# Patient Record
Sex: Male | Born: 1955 | Race: White | Hispanic: No | Marital: Married | Smoking: Current every day smoker
Health system: Southern US, Community
[De-identification: ages and names within clinical notes are randomized; demographics above are authoritative.]

## PROBLEM LIST (undated history)

## (undated) DIAGNOSIS — J449 Chronic obstructive pulmonary disease, unspecified: Secondary | ICD-10-CM

## (undated) DIAGNOSIS — I10 Essential (primary) hypertension: Secondary | ICD-10-CM

---

## 2018-03-23 ENCOUNTER — Emergency Department: Payer: BLUE CROSS/BLUE SHIELD

## 2018-03-23 ENCOUNTER — Encounter: Payer: Self-pay | Admitting: Emergency Medicine

## 2018-03-23 ENCOUNTER — Other Ambulatory Visit: Payer: Self-pay

## 2018-03-23 ENCOUNTER — Emergency Department
Admission: EM | Admit: 2018-03-23 | Discharge: 2018-03-23 | Discharge status: Against Medical Advice | Payer: BLUE CROSS/BLUE SHIELD | Attending: Emergency Medicine | Admitting: Emergency Medicine

## 2018-03-23 DIAGNOSIS — Y999 Unspecified external cause status: Secondary | ICD-10-CM | POA: Diagnosis not present

## 2018-03-23 DIAGNOSIS — I10 Essential (primary) hypertension: Secondary | ICD-10-CM | POA: Diagnosis not present

## 2018-03-23 DIAGNOSIS — S22060A Wedge compression fracture of T7-T8 vertebra, initial encounter for closed fracture: Secondary | ICD-10-CM | POA: Diagnosis not present

## 2018-03-23 DIAGNOSIS — Y92002 Bathroom of unspecified non-institutional (private) residence single-family (private) house as the place of occurrence of the external cause: Secondary | ICD-10-CM | POA: Insufficient documentation

## 2018-03-23 DIAGNOSIS — Y9389 Activity, other specified: Secondary | ICD-10-CM | POA: Insufficient documentation

## 2018-03-23 DIAGNOSIS — F17200 Nicotine dependence, unspecified, uncomplicated: Secondary | ICD-10-CM | POA: Insufficient documentation

## 2018-03-23 DIAGNOSIS — W010XXA Fall on same level from slipping, tripping and stumbling without subsequent striking against object, initial encounter: Secondary | ICD-10-CM | POA: Diagnosis not present

## 2018-03-23 DIAGNOSIS — S24109A Unspecified injury at unspecified level of thoracic spinal cord, initial encounter: Secondary | ICD-10-CM | POA: Diagnosis present

## 2018-03-23 DIAGNOSIS — G9520 Unspecified cord compression: Secondary | ICD-10-CM | POA: Diagnosis not present

## 2018-03-23 HISTORY — DX: Essential (primary) hypertension: I10

## 2018-03-23 MED ORDER — LORAZEPAM 2 MG/ML IJ SOLN
1.0000 mg | Freq: Once | INTRAMUSCULAR | Status: AC | PRN
  Administered 2018-03-23: 1 mg via INTRAVENOUS
  Filled 2018-03-23: qty 1

## 2018-03-23 MED ORDER — LORAZEPAM 2 MG/ML IJ SOLN
1.0000 mg | Freq: Once | INTRAMUSCULAR | Status: AC | PRN
  Administered 2018-03-23: 1 mg via INTRAVENOUS

## 2018-03-23 NOTE — Consult Note (Signed)
NEUROSURGERY INPATIENT CONSULT  Aaron Ortiz 1956/09/17 [id number] 03/23/2018 0  Date of Service:  03/23/2018  Patient Care Team: Patient, No Pcp Per as PCP - General (General Practice)  Consult Requested by:  Nita Sickle  Chief Complaint: Difficulty walking  History of Present Illness: 62 year old gentleman was in his normal state of health until he suffered a fall in February 2019.  Noted some mild back pain afterwards.  Slowly he developed difficulty with ambulation.  He began having difficulty controlling his legs and noticing leg weakness.  He began using a cane for assistance with ambulation.  This progressed to where he is now using a walker.  The patient presented to the emergency department today for evaluation.  Imaging was obtained of the thoracic spine demonstrating a fracture as well as cord signal change at the location of the fracture.  Currently the patient denies back pain.  He reports numbness from the mid abdomen down to his feet.  He describes weakness in his legs.  He describes difficulty with standing and walking and transferring from sitting to stand and stand to sit.  He currently denies any incontinence of bowel and urine.  He reports that he has not had erectile function for a long time.  He denies a history of diabetes.  He admits to a history of hypertension.  He admits that he has not been seen by medical professional in quite some time.  Past Medical History  There are no active problems to display for this patient.   Past Medical History:  Diagnosis Date  . Hypertension      History reviewed. No pertinent surgical history.   No Known Allergies   Social History   Tobacco Use  . Smoking status: Current Every Day Smoker  . Smokeless tobacco: Never Used  Substance Use Topics  . Alcohol use: Not on file     No family history on file.   ROS: As per HPI all others negative  EXAMINATION  VITALS: BP (!) 142/86   Pulse 74   Temp 97.8 F (36.6 C)  (Oral)   Resp 18   Ht  (1.778 m)   Wt 111.1 kg (245 lb)   SpO2 93%   BMI 35.15 kg/m    Vitals Current Average / Min / Max     Temp    97.8 F (36.6 C)    Temp  Min: 97.8 F (36.6 C)  Max: 97.8 F (36.6 C)     BP     (!) 142/86     BP  Min: 142/86  Max: 189/69     HR    74    Pulse  Avg: 77  Min: 74  Max: 79     RR    18    Resp  Avg: 18.7  Min: 18  Max: 20     Sats    93 %    SpO2  Min: 93 %  Max: 98 %     Weight    111.1 kg (245 lb)    Admit: 111.1 kg (245 lb)   GENERAL: Awake, alert, oriented by 3   MUSCULOSKELETAL:  Normal bulk and tone of upper and lower extremities.   No abnormal movements or fasiculations.   HIGHER INTEGRATIVE FUNCTIONS:  GCS: 15 Oriented to person, place, and time Recent and remote memory normal   STRENGTH:  Bedside strength is 5/5; the patient is obviously weak when standing and transitioning from sit to stand, stand to  sit   REFLEXES: Normal 1+ patellar and Achilles bilaterally.  Babinski: Toes downgoing Clonus: None Impaired position sense bilaterally   SENSATION: Diffuse hypesthesia extending from the mid abdomen to the feet bilaterally  IMAGING: I personally reviewed the patient's thoracic MRI with the patient.  This shows a T7 fracture with kyphotic angulation, stretching of the thoracic cord, with abnormal cord signal change.  Please see the radiology report for additional details.  CT of the thoracic spine demonstrates T7 fracture.  Please see the radiology report for additional details.  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  I discussed in detail with the patient and his wife the nature, risks, and alternatives of the various management options.  Given the progressive deterioration of his neurologic functioning as well as the severe fracture and thoracic kyphosis, we recommend transfer for surgical  decompression and stabilization.  The procedure is beyond the capability of [facility].  The patient is apprehensive to proceed with surgery.  He repeated multiple times that he needed to think about this.  His wife encouraged him to agree to the transfer for treatment.  The patient however ultimately declined.  Dr. Don Perking also spoke with the patient regarding the risks of not proceeding with surgical intervention and the patient remained adamant that he did not want to transfer and would rather go home to consider his options.  He was provided with the contact numbers for the neurosurgery department at [facility] as well as [facility].  He was encouraged that should he change his mind that he proceed directly to either the [facility] or the [facility] emergency department.  This note was dictated using voice recognition software.  Please contact me if there are any questions regarding its content.  Electronically signed by: Ninfa Meeker, III, MD 03/23/2018 1:08 PM

## 2018-03-23 NOTE — ED Notes (Signed)
Pt back from CT at this time 

## 2018-03-23 NOTE — ED Notes (Signed)
Patient transported to CT 

## 2018-03-23 NOTE — ED Provider Notes (Signed)
[facility] Emergency Department Provider Note  ____________________________________________  Time seen: Approximately 7:33 AM  I have reviewed the triage vital signs and the nursing notes.   HISTORY  Chief Complaint Back Pain   HPI Aaron Ortiz is a 62 y.o. male with a history of hypertension who presents for evaluation of back pain.  Patient reports that he has had back pain and numbness in his bilateral lower extremities for 3 weeks.  He reports that he slipped in the bathroom and fell forward onto the ground.  He reports mild pain in his lower thoracic/upper lumbar spine region that was present for 1 day after the fall. The pain resolved with no intervention.  Patient denies having back pain at this time however reports that he feels numb starting at the same level on his back all the way down to his feet.  The numbness started immediately after the fall and has remained constant for 3 weeks and is not getting worse.  According to the wife his gait has been a little "wobbly" for the last 3 weeks.  Patient denies saddle anesthesia, urinary or bowel incontinence or retention, unintentional weight loss, night sweats, history of cancer.  Past Medical History:  Diagnosis Date  . Hypertension     Prior to Admission medications   Not on File    Allergies Patient has no known allergies.  FH No FH of cancer  Social History Social History   Tobacco Use  . Smoking status: Current Every Day Smoker  . Smokeless tobacco: Never Used  Substance Use Topics  . Alcohol use: Not on file  . Drug use: Not on file    Review of Systems  Constitutional: Negative for fever. Eyes: Negative for visual changes. ENT: Negative for sore throat. Neck: No neck pain  Cardiovascular: Negative for chest pain. Respiratory: Negative for shortness of breath. Gastrointestinal: Negative for abdominal pain, vomiting or diarrhea. Genitourinary: Negative for  dysuria. Musculoskeletal: + back pain. Skin: Negative for rash. Neurological: Negative for headaches. + numbness of bilateral legs Psych: No SI or HI  ____________________________________________   PHYSICAL EXAM:  VITAL SIGNS: ED Triage Vitals  Enc Vitals Group     BP 03/23/18 0527 (!) 189/69     Pulse Rate 03/23/18 0527 79     Resp 03/23/18 0527 20     Temp 03/23/18 0527 97.8 F (36.6 C)     Temp Source 03/23/18 0527 Oral     SpO2 03/23/18 0527 97 %     Weight 03/23/18 0526 245 lb (111.1 kg)     Height 03/23/18 0526  (1.778 m)     Head Circumference --      Peak Flow --      Pain Score 03/23/18 0626 0     Pain Loc --      Pain Edu? --      Excl. in GC? --     Constitutional: Alert and oriented. Well appearing and in no apparent distress. HEENT:      Head: Normocephalic and atraumatic.         Eyes: Conjunctivae are normal. Sclera is non-icteric.       Mouth/Throat: Mucous membranes are moist.       Neck: Supple with no signs of meningismus. Cardiovascular: Regular rate and rhythm. No murmurs, gallops, or rubs. 2+ symmetrical distal pulses are present in all extremities. No JVD. Respiratory: Normal respiratory effort. Lungs are clear to auscultation bilaterally. No wheezes, crackles, or rhonchi.  Gastrointestinal: Soft, non tender, and non distended with positive bowel sounds. No rebound or guarding. Musculoskeletal: no Tenderness to palpation over CT and L-spine. Neurologic: Normal speech and language. Face is symmetric.  Intact strength and sensation to painful stimuli, DTRs are 1+ bilaterally, PVR 26 cc  skin: Skin is warm, dry and intact. No rash noted. Psychiatric: Mood and affect are normal. Speech and behavior are normal.  ____________________________________________   LABS (all labs ordered are listed, but only abnormal results are displayed)  Labs Reviewed - No data to  display ____________________________________________  EKG  none ____________________________________________  RADIOLOGY  I have personally reviewed the images performed during this visit and I agree with the Radiologist's read.   Interpretation by Radiologist:  Ct Thoracic Spine Wo Contrast  Result Date: 03/23/2018 CLINICAL DATA:  Injury 3 weeks ago with persistent back pain. EXAM: CT THORACIC SPINE WITHOUT CONTRAST TECHNIQUE: Multidetector CT images of the thoracic were obtained using the standard protocol without intravenous contrast. COMPARISON:  None. FINDINGS: Alignment: Exaggerated thoracic kyphosis.  No listhesis. Vertebrae: T7 body fracture with compression and sclerosis. Anteriorly there is vertebral plana. Retropulsion is mild, but the underlying thecal sac is already effaced by dorsal epidural fat expansion. There is paravertebral strandy edematous appearance suggesting subacute timing. No visible paravertebral tumor or erosive component. There is a T7 spinous process fracture with well corticated/healed appearing margins. Right fourth, fifth, and sixth rib neck is show healing fractures with callus, nondisplaced. No evidence of discitis or aggressive bone lesion. Paraspinal and other soft tissues: Large hiatal hernia. 6 mm average diameter subpleural pulmonary nodule in the medial right lower lobe. Disc levels: Diffuse spondylosis and disc narrowing. Diffuse mild facet hypertrophy. Generalized narrow appearance of the foramina IMPRESSION: 1. T7 body fracture that is likely subacute. Anterior height loss is severe. Retropulsion is mild but there is probable cord impingement - the thecal sac is effaced from behind by epidural fat expansion. MRI could further evaluate. 2. T7 spinous process fracture with corticated margins. There is exaggerated thoracic kyphosis without listhesis. 3. Healing right fourth, fifth, and sixth rib neck fractures. 4. 6 mm right lower lobe pulmonary nodule.  Non-contrast chest CT at 6-12 months is recommended. If the nodule is stable at time of repeat CT, then future CT at 18-24 months (from today's scan) is considered optional for low-risk patients, but is recommended for high-risk patients. This recommendation follows the consensus statement: Guidelines for Management of Incidental Pulmonary Nodules Detected on CT Images: From the Fleischner Society 2017; Radiology 2017; 284:228-243. 5. Intrathoracic stomach. Electronically Signed   By: Marnee Spring M.D.   On: 03/23/2018 08:04   Ct Lumbar Spine Wo Contrast  Result Date: 03/23/2018 CLINICAL DATA:  Fall 3 weeks ago with back pain.  Initial encounter. EXAM: CT LUMBAR SPINE WITHOUT CONTRAST TECHNIQUE: Multidetector CT imaging of the lumbar spine was performed without intravenous contrast administration. Multiplanar CT image reconstructions were also generated. COMPARISON:  None. FINDINGS: Segmentation: Based on the lowest ribs there are 5 lumbar type vertebral bodies. Alignment: Grade 1 anterolisthesis at L5-S1 Vertebrae: Negative for acute fracture. No destructive changes or discitis. Chronic bilateral L5 pars defects. Paraspinal and other soft tissues: 2.5 cm left adrenal nodule with densitometry suggesting adenoma. Disc levels: Generalized fairly mild spondylosis from T12-L1 to L4-5. L5 chronic bilateral pars defects with grade 1 L5-S1 anterolisthesis. There is focal disc degeneration with central disc fissure containing gas. Disc height loss, bulge, and slip causes advanced biforaminal L5 impingement. Patent spinal canal IMPRESSION: 1.  No acute finding. 2. L5 chronic bilateral pars defects with listhesis and accelerated disc degeneration. Biforaminal L5 compression. 3. 2.5 cm left adrenal nodule with densitometry indicating adenoma. Electronically Signed   By: Marnee Spring M.D.   On: 03/23/2018 08:07   Mr Thoracic Spine Wo Contrast  Addendum Date: 03/23/2018   ADDENDUM REPORT: 03/23/2018 12:22 ADDENDUM:  Critical Value/emergent results were called by telephone at the time of interpretation on 03/23/2018 at 1217 hours to Dr. Scotty Court in the ED, who verbally acknowledged these results. Electronically Signed   By: Odessa Fleming M.D.   On: 03/23/2018 12:22   Result Date: 03/23/2018 CLINICAL DATA:  62 year old male status post slip and fall at home 3 weeks ago with persistent back pain. Pain and numbness radiating to the legs. Severe T7 compression fracture on thoracic spine CT today. EXAM: MRI THORACIC SPINE WITHOUT CONTRAST TECHNIQUE: Multiplanar, multisequence MR imaging of the thoracic spine was performed. No intravenous contrast was administered. COMPARISON:  Thoracic spine CT 0727 hours today. FINDINGS: Limited cervical spine imaging:  Negative. Thoracic spine segmentation:  Normal. Alignment: Stable from the CT earlier today demonstrating focal kyphosis of about 23 degrees about the abnormal T7 vertebral body. Vertebrae: Severe T7 compression fracture approaching vertebra plana. There is abnormal T1 marrow signal within the compressed body, but STIR imaging demonstrates only trace marrow edema in the body. There is up to moderate marrow edema demonstrated in the bilateral T7-T8 facets. Otherwise the posterior elements of those levels appear intact. There is trace marrow edema at the posterior inferior T6 endplate which is also probably reactive. There is mild marrow edema in the posterior right 7th rib where a nondisplaced fracture is evident on the CT today. The other regional posterior rib marrow signal appears normal. The other thoracic levels are intact. No other marrow edema or acute osseous abnormality identified. Cord: There is abnormal spinal cord signal at the T7 level corresponding to spinal stenosis due to combined mild bony retropulsion and moderate thoracic epidural lipomatosis. See series 8, image 7 and series 7, image 19. Mild associated spinal cord mass effect. Above and below that level spinal cord  signal appears to remain normal despite additional spinal stenosis primarily due to epidural lipomatosis. Paraspinal and other soft tissues: Confluent paraspinal soft tissue edema laterally at the T7-T8 levels. This is seen on series 8, image 12 on the left side. The posterior paraspinal soft tissues remain within normal limits. Stable visible thoracic and upper abdominal viscera from the CT today including hiatal hernia. Disc levels: No thoracic spinal stenosis above T5. T5-T6: Mild to moderate thoracic epidural lipomatosis resulting in mild spinal stenosis. T6-T7: Moderate thoracic epidural lipomatosis contributing to spinal stenosis along with mild T7 retropulsion. There is also up to severe bilateral T6 foraminal stenosis related to the T7 compression fracture and loss of height, greater on the left. T7-T8: Moderate thoracic epidural lipomatosis contributing to spinal stenosis along with the T7 retropulsion. There is associated moderate to severe bilateral T7 foraminal stenosis, greater on the right. T8-T9: Mild disc bulging and moderate thoracic epidural lipomatosis resulting in spinal stenosis with effaced CSF from the thecal sac but mild if any cord mass effect. T9-T10: Continued epidural lipomatosis but less thoracic spinal stenosis at this level with some preserved CSF within the thecal sac. T10-T11: Mild disc bulge. Decreasing epidural lipomatosis. Mild to moderate facet hypertrophy. Mild spinal stenosis without cord mass effect. Mild left and moderate right T10 foraminal stenosis. T11-T12: No stenosis. IMPRESSION: 1. Severe T7 compression fracture  approaching vertebra plana. Combined mild retropulsion plus moderate thoracic epidural lipomatosis results in spinal cord compression with abnormal spinal cord signal as seen on series 8, image 7. Recommend Spine Surgery Consultation. 2. Up to moderate mid and lower thoracic spine epidural lipomatosis primarily contributing to thoracic spinal stenosis from T5-T6  to T10-T11. No other abnormal thoracic spinal cord signal identified. 3. Little marrow edema in the severely compressed T7 body. There is moderate marrow edema in the bilateral T7 and T8 facets which is probably stress reaction in light of no discrete facet fractures on the CT Thoracic Spine today. Mild similar edema in the posterior inferior T6 vertebral body. 4. Fractured right 7th rib costovertebral junction with some marrow edema. Electronically Signed: By: Odessa Fleming M.D. On: 03/23/2018 12:14      ____________________________________________   PROCEDURES  Procedure(s) performed: None Procedures Critical Care performed:  None ____________________________________________   INITIAL IMPRESSION / ASSESSMENT AND PLAN / ED COURSE   62 y.o. male with a history of hypertension who presents for evaluation of back pain and b/l LE numbness.  Patient is well-appearing, no distress, has no tenderness on midline of his spine, has symmetric 1+ DTRs bilateral lower extremities, intact strength and sensation to pinprick, normal PVR 26cc. Due to traumatic nature of the injury patient was sent for CT of the spine which showed T7 compression fracture and retropulsion. MRI pending.   _________________________ 1:24 PM on 03/23/2018 -----------------------------------------  MRI was done which is consistent with severe T7 compression fracture and epidural lipomatosis resulting in severe spinal cord compression.  Patient was evaluated in the emergency department by Dr. Riley Nearing, NSG on call who recommended emergent transfer to [facility] for surgical repair.  Patient told both of his that he does not wish surgery.  He was made very clear to the patient by me and Dr. Riley Nearing that this injury will most likely put him on a wheelchair for the rest of his life and make him paralyzed from the waist down.  Patient understand the risks associated with this but wishes to go home and think about it before deciding if he wants to  do surgery.  Patient does understand the severity and how time sensitive this injury is but continues to demand to go home.  His wife is with him and will do his wishes.  I am referring him to [facility] neurosurgery.  I told him that if he changes his mind for him to go immediately to [facility] or [facility] emergency department as this injury cannot be repaired at this hospital.  Patient understands that.  He is leaving AGAINST MEDICAL ADVICE.      As part of my medical decision making, I reviewed the following data within the electronic MEDICAL RECORD NUMBER History obtained from family, Nursing notes reviewed and incorporated, Radiograph reviewed , A consult was requested and obtained from this/these consultant(s) Neurosurgery, Notes from prior ED visits and [place] Controlled Substance Database    Pertinent labs & imaging results that were available during my care of the patient were reviewed by me and considered in my medical decision making (see chart for details).    ____________________________________________   FINAL CLINICAL IMPRESSION(S) / ED DIAGNOSES  Final diagnoses:  Spinal cord compression (HCC)  Compression fracture of T7 vertebra (HCC)      NEW MEDICATIONS STARTED DURING THIS VISIT:  ED Discharge Orders    None       Note:  This document was prepared using Dragon voice recognition software and may  include unintentional dictation errors.    Don Perking, [place], MD 03/23/18 [phone]

## 2018-03-23 NOTE — ED Triage Notes (Addendum)
Pt to triage via w/c with no distress noted; st slipped in BR 3wks ago injuring back; c/o persistent back pain with numbness/pain to legs; pt denies c/o pain while sitting at present

## 2018-03-23 NOTE — ED Notes (Signed)
Pt pulses found on both feet with doppler. MD aware

## 2018-03-23 NOTE — ED Notes (Signed)
Pt taken to MRI by Genelle Bal EDT

## 2018-03-23 NOTE — Discharge Instructions (Addendum)
As we explained to you, you have a fracture in your back that is pushing against your spinal cord.  This may cause you to be paralyzed from the waist down and be on a wheelchair for the rest of your life.  Unfortunately you did not want surgical repair which is the only way to fix this problem.  If you change your mind please make sure to go to [facility] or [facility] emergency department as soon as possible for further evaluation as this injury cannot be fixed here.   please make sure to call [facility] neurosurgery's number above to schedule an appointment as soon as possible.

## 2018-03-23 NOTE — ED Notes (Signed)
Pt advised not to leave facility due to current condition but pt verbalized that he understood and still wants to leave and go home. Surgeon and attending aware and pt adamant about leaving. Pt signed AMA paperwork and was wheeled to car.

## 2018-03-23 NOTE — ED Notes (Signed)
Neurosurgeon at bedside °

## 2018-03-23 NOTE — ED Notes (Signed)
Pt resting quietly with wife at bedside. Awaiting MRI to call for exam.

## 2018-03-23 NOTE — ED Notes (Signed)
Pt has 28ml of urine left post void.

## 2018-05-31 ENCOUNTER — Other Ambulatory Visit: Payer: Self-pay | Admitting: Family Medicine

## 2018-05-31 DIAGNOSIS — I87339 Chronic venous hypertension (idiopathic) with ulcer and inflammation of unspecified lower extremity: Secondary | ICD-10-CM

## 2018-05-31 DIAGNOSIS — L97909 Non-pressure chronic ulcer of unspecified part of unspecified lower leg with unspecified severity: Secondary | ICD-10-CM

## 2018-06-01 ENCOUNTER — Other Ambulatory Visit: Payer: Self-pay | Admitting: Family Medicine

## 2018-06-01 DIAGNOSIS — I70223 Atherosclerosis of native arteries of extremities with rest pain, bilateral legs: Secondary | ICD-10-CM

## 2018-06-01 DIAGNOSIS — L97909 Non-pressure chronic ulcer of unspecified part of unspecified lower leg with unspecified severity: Secondary | ICD-10-CM

## 2018-06-01 DIAGNOSIS — I87339 Chronic venous hypertension (idiopathic) with ulcer and inflammation of unspecified lower extremity: Secondary | ICD-10-CM

## 2018-06-03 ENCOUNTER — Ambulatory Visit: Admission: RE | Admit: 2018-06-03 | Payer: BLUE CROSS/BLUE SHIELD | Source: Ambulatory Visit

## 2018-08-31 ENCOUNTER — Inpatient Hospital Stay
Admission: EM | Admit: 2018-08-31 | Discharge: 2018-09-24 | DRG: 871 | Disposition: E | Payer: BLUE CROSS/BLUE SHIELD | Attending: Internal Medicine | Admitting: Internal Medicine

## 2018-08-31 ENCOUNTER — Emergency Department: Payer: BLUE CROSS/BLUE SHIELD

## 2018-08-31 ENCOUNTER — Inpatient Hospital Stay
Admit: 2018-08-31 | Discharge: 2018-08-31 | Disposition: A | Discharge status: Home / Self Care | Payer: BLUE CROSS/BLUE SHIELD | Attending: Internal Medicine | Admitting: Internal Medicine

## 2018-08-31 ENCOUNTER — Other Ambulatory Visit: Payer: Self-pay

## 2018-08-31 DIAGNOSIS — A419 Sepsis, unspecified organism: Secondary | ICD-10-CM | POA: Diagnosis not present

## 2018-08-31 DIAGNOSIS — G8220 Paraplegia, unspecified: Secondary | ICD-10-CM | POA: Diagnosis present

## 2018-08-31 DIAGNOSIS — J189 Pneumonia, unspecified organism: Secondary | ICD-10-CM

## 2018-08-31 DIAGNOSIS — T17990A Other foreign object in respiratory tract, part unspecified in causing asphyxiation, initial encounter: Secondary | ICD-10-CM | POA: Diagnosis present

## 2018-08-31 DIAGNOSIS — I248 Other forms of acute ischemic heart disease: Secondary | ICD-10-CM | POA: Diagnosis present

## 2018-08-31 DIAGNOSIS — F17210 Nicotine dependence, cigarettes, uncomplicated: Secondary | ICD-10-CM | POA: Diagnosis present

## 2018-08-31 DIAGNOSIS — X58XXXA Exposure to other specified factors, initial encounter: Secondary | ICD-10-CM | POA: Diagnosis present

## 2018-08-31 DIAGNOSIS — M4854XA Collapsed vertebra, not elsewhere classified, thoracic region, initial encounter for fracture: Secondary | ICD-10-CM | POA: Diagnosis present

## 2018-08-31 DIAGNOSIS — I110 Hypertensive heart disease with heart failure: Secondary | ICD-10-CM | POA: Diagnosis present

## 2018-08-31 DIAGNOSIS — N179 Acute kidney failure, unspecified: Secondary | ICD-10-CM | POA: Diagnosis present

## 2018-08-31 DIAGNOSIS — J440 Chronic obstructive pulmonary disease with acute lower respiratory infection: Secondary | ICD-10-CM | POA: Diagnosis present

## 2018-08-31 DIAGNOSIS — R7989 Other specified abnormal findings of blood chemistry: Secondary | ICD-10-CM

## 2018-08-31 DIAGNOSIS — J9601 Acute respiratory failure with hypoxia: Secondary | ICD-10-CM

## 2018-08-31 DIAGNOSIS — R778 Other specified abnormalities of plasma proteins: Secondary | ICD-10-CM

## 2018-08-31 DIAGNOSIS — J181 Lobar pneumonia, unspecified organism: Secondary | ICD-10-CM | POA: Diagnosis present

## 2018-08-31 DIAGNOSIS — I872 Venous insufficiency (chronic) (peripheral): Secondary | ICD-10-CM | POA: Diagnosis present

## 2018-08-31 DIAGNOSIS — K7200 Acute and subacute hepatic failure without coma: Secondary | ICD-10-CM | POA: Diagnosis present

## 2018-08-31 DIAGNOSIS — J9690 Respiratory failure, unspecified, unspecified whether with hypoxia or hypercapnia: Secondary | ICD-10-CM

## 2018-08-31 DIAGNOSIS — E875 Hyperkalemia: Secondary | ICD-10-CM | POA: Diagnosis present

## 2018-08-31 DIAGNOSIS — Z79899 Other long term (current) drug therapy: Secondary | ICD-10-CM

## 2018-08-31 DIAGNOSIS — Z66 Do not resuscitate: Secondary | ICD-10-CM | POA: Diagnosis present

## 2018-08-31 DIAGNOSIS — G9341 Metabolic encephalopathy: Secondary | ICD-10-CM | POA: Diagnosis present

## 2018-08-31 DIAGNOSIS — R6521 Severe sepsis with septic shock: Secondary | ICD-10-CM | POA: Diagnosis present

## 2018-08-31 DIAGNOSIS — I509 Heart failure, unspecified: Secondary | ICD-10-CM | POA: Diagnosis present

## 2018-08-31 HISTORY — DX: Chronic obstructive pulmonary disease, unspecified: J44.9

## 2018-08-31 LAB — CBC
HCT: 49.2 % (ref 40.0–52.0)
Hemoglobin: 16.7 g/dL (ref 13.0–18.0)
MCH: 35.2 pg — ABNORMAL HIGH (ref 26.0–34.0)
MCHC: 34 g/dL (ref 32.0–36.0)
MCV: 103.3 fL — AB (ref 80.0–100.0)
PLATELETS: 247 10*3/uL (ref 150–440)
RBC: 4.76 MIL/uL (ref 4.40–5.90)
RDW: 13 % (ref 11.5–14.5)
WBC: 17.3 10*3/uL — ABNORMAL HIGH (ref 3.8–10.6)

## 2018-08-31 LAB — LACTIC ACID, PLASMA
LACTIC ACID, VENOUS: 1.5 mmol/L (ref 0.5–1.9)
Lactic Acid, Venous: 1.8 mmol/L (ref 0.5–1.9)

## 2018-08-31 LAB — COMPREHENSIVE METABOLIC PANEL
ALBUMIN: 3.8 g/dL (ref 3.5–5.0)
ALT: 1077 U/L — AB (ref 0–44)
AST: 673 U/L — AB (ref 15–41)
Alkaline Phosphatase: 92 U/L (ref 38–126)
Anion gap: 15 (ref 5–15)
BUN: 55 mg/dL — AB (ref 8–23)
CHLORIDE: 82 mmol/L — AB (ref 98–111)
CO2: 35 mmol/L — AB (ref 22–32)
CREATININE: 3.01 mg/dL — AB (ref 0.61–1.24)
Calcium: 9.2 mg/dL (ref 8.9–10.3)
GFR calc Af Amer: 24 mL/min — ABNORMAL LOW (ref >60)
GFR, EST NON AFRICAN AMERICAN: 20 mL/min — AB (ref >60)
GLUCOSE: 126 mg/dL — AB (ref 70–99)
Potassium: 5.8 mmol/L — ABNORMAL HIGH (ref 3.5–5.1)
Sodium: 132 mmol/L — ABNORMAL LOW (ref 135–145)
Total Bilirubin: 1.2 mg/dL (ref 0.3–1.2)
Total Protein: 7.8 g/dL (ref 6.5–8.1)

## 2018-08-31 LAB — BASIC METABOLIC PANEL
Anion gap: 10 (ref 5–15)
BUN: 59 mg/dL — AB (ref 8–23)
CHLORIDE: 91 mmol/L — AB (ref 98–111)
CO2: 33 mmol/L — AB (ref 22–32)
CREATININE: 2.99 mg/dL — AB (ref 0.61–1.24)
Calcium: 8.7 mg/dL — ABNORMAL LOW (ref 8.9–10.3)
GFR calc Af Amer: 24 mL/min — ABNORMAL LOW (ref >60)
GFR calc non Af Amer: 21 mL/min — ABNORMAL LOW (ref >60)
GLUCOSE: 101 mg/dL — AB (ref 70–99)
Potassium: 5.5 mmol/L — ABNORMAL HIGH (ref 3.5–5.1)
Sodium: 134 mmol/L — ABNORMAL LOW (ref 135–145)

## 2018-08-31 LAB — GLUCOSE, CAPILLARY
GLUCOSE-CAPILLARY: 131 mg/dL — AB (ref 70–99)
GLUCOSE-CAPILLARY: 104 mg/dL — AB (ref 70–99)
Glucose-Capillary: 85 mg/dL (ref 70–99)

## 2018-08-31 LAB — TROPONIN I
TROPONIN I: 0.92 ng/mL — AB (ref <0.03)
TROPONIN I: 1.2 ng/mL — AB (ref <0.03)

## 2018-08-31 LAB — BRAIN NATRIURETIC PEPTIDE: B Natriuretic Peptide: 1227 pg/mL — ABNORMAL HIGH (ref 0.0–100.0)

## 2018-08-31 LAB — MRSA PCR SCREENING: MRSA by PCR: NEGATIVE

## 2018-08-31 MED ORDER — SODIUM CHLORIDE 0.9 % IV SOLN
1.0000 g | INTRAVENOUS | Status: DC
  Filled 2018-08-31: qty 10

## 2018-08-31 MED ORDER — INSULIN ASPART 100 UNIT/ML ~~LOC~~ SOLN
10.0000 [IU] | Freq: Once | SUBCUTANEOUS | Status: AC
  Administered 2018-08-31: 10 [IU] via INTRAVENOUS
  Filled 2018-08-31: qty 1

## 2018-08-31 MED ORDER — PHENYLEPHRINE HCL-NACL 10-0.9 MG/250ML-% IV SOLN
0.0000 ug/min | INTRAVENOUS | Status: DC
  Administered 2018-08-31: 20 ug/min via INTRAVENOUS
  Administered 2018-09-01: 150 ug/min via INTRAVENOUS
  Administered 2018-09-01: 80 ug/min via INTRAVENOUS
  Filled 2018-08-31: qty 10
  Filled 2018-08-31: qty 250
  Filled 2018-08-31: qty 10
  Filled 2018-08-31: qty 250
  Filled 2018-08-31: qty 40
  Filled 2018-08-31: qty 250

## 2018-08-31 MED ORDER — SODIUM CHLORIDE 0.9 % IV SOLN
500.0000 mg | INTRAVENOUS | Status: DC
  Filled 2018-08-31: qty 500

## 2018-08-31 MED ORDER — METHYLPREDNISOLONE SODIUM SUCC 125 MG IJ SOLR: 60.0000 mg | INTRAMUSCULAR | Status: DC

## 2018-08-31 MED ORDER — ACETAMINOPHEN 650 MG RE SUPP: 650.0000 mg | Freq: Four times a day (QID) | RECTAL | Status: DC | PRN

## 2018-08-31 MED ORDER — METHYLPREDNISOLONE SODIUM SUCC 125 MG IJ SOLR
60.0000 mg | INTRAMUSCULAR | Status: DC
  Administered 2018-08-31: 60 mg via INTRAVENOUS
  Filled 2018-08-31: qty 2

## 2018-08-31 MED ORDER — IPRATROPIUM-ALBUTEROL 0.5-2.5 (3) MG/3ML IN SOLN
3.0000 mL | Freq: Four times a day (QID) | RESPIRATORY_TRACT | Status: DC
  Administered 2018-08-31 – 2018-09-01 (×3): 3 mL via RESPIRATORY_TRACT
  Filled 2018-08-31 (×3): qty 3

## 2018-08-31 MED ORDER — BISACODYL 5 MG PO TBEC
5.0000 mg | DELAYED_RELEASE_TABLET | Freq: Every day | ORAL | Status: DC | PRN
  Filled 2018-08-31: qty 1

## 2018-08-31 MED ORDER — SODIUM BICARBONATE 8.4 % IV SOLN
50.0000 meq | Freq: Once | INTRAVENOUS | Status: AC
  Administered 2018-08-31: 50 meq via INTRAVENOUS
  Filled 2018-08-31: qty 50

## 2018-08-31 MED ORDER — SODIUM CHLORIDE 0.9 % IV SOLN
500.0000 mg | INTRAVENOUS | Status: DC
  Administered 2018-08-31: 500 mg via INTRAVENOUS
  Filled 2018-08-31 (×2): qty 500

## 2018-08-31 MED ORDER — ONDANSETRON HCL 4 MG/2ML IJ SOLN: 4.0000 mg | Freq: Four times a day (QID) | INTRAMUSCULAR | Status: DC | PRN

## 2018-08-31 MED ORDER — INSULIN ASPART 100 UNIT/ML ~~LOC~~ SOLN
10.0000 [IU] | Freq: Once | SUBCUTANEOUS | Status: DC
  Filled 2018-08-31: qty 1

## 2018-08-31 MED ORDER — SODIUM CHLORIDE 0.9 % IV SOLN
1.0000 g | Freq: Once | INTRAVENOUS | Status: AC
  Administered 2018-08-31: 1 g via INTRAVENOUS
  Filled 2018-08-31: qty 10

## 2018-08-31 MED ORDER — SODIUM CHLORIDE 0.9 % IV SOLN
INTRAVENOUS | Status: DC
  Administered 2018-08-31: 50 mL/h via INTRAVENOUS

## 2018-08-31 MED ORDER — SODIUM CHLORIDE 0.9 % IV SOLN
2.0000 g | INTRAVENOUS | Status: DC
  Administered 2018-08-31: 2 g via INTRAVENOUS
  Filled 2018-08-31 (×2): qty 20

## 2018-08-31 MED ORDER — SODIUM CHLORIDE 0.9 % IV BOLUS
1000.0000 mL | Freq: Once | INTRAVENOUS | Status: AC
  Administered 2018-08-31: 1000 mL via INTRAVENOUS

## 2018-08-31 MED ORDER — PERFLUTREN LIPID MICROSPHERE
1.0000 mL | INTRAVENOUS | Status: AC | PRN
  Administered 2018-08-31: 3 mL via INTRAVENOUS

## 2018-08-31 MED ORDER — HEPARIN SODIUM (PORCINE) 5000 UNIT/ML IJ SOLN
5000.0000 [IU] | Freq: Three times a day (TID) | INTRAMUSCULAR | Status: DC
  Administered 2018-08-31 (×2): 5000 [IU] via SUBCUTANEOUS
  Filled 2018-08-31 (×2): qty 1

## 2018-08-31 MED ORDER — PHENYLEPHRINE HCL-NACL 10-0.9 MG/250ML-% IV SOLN
0.0000 ug/min | INTRAVENOUS | Status: DC
  Filled 2018-08-31: qty 250

## 2018-08-31 MED ORDER — ACETAMINOPHEN 325 MG PO TABS: 650.0000 mg | ORAL_TABLET | Freq: Four times a day (QID) | ORAL | Status: DC | PRN

## 2018-08-31 MED ORDER — NOREPINEPHRINE 4 MG/250ML-% IV SOLN
0.0000 ug/min | INTRAVENOUS | Status: DC
  Filled 2018-08-31 (×2): qty 250

## 2018-08-31 MED ORDER — DOCUSATE SODIUM 100 MG PO CAPS: 100.0000 mg | ORAL_CAPSULE | Freq: Two times a day (BID) | ORAL | Status: DC

## 2018-08-31 MED ORDER — ONDANSETRON HCL 4 MG PO TABS: 4.0000 mg | ORAL_TABLET | Freq: Four times a day (QID) | ORAL | Status: DC | PRN

## 2018-08-31 MED ORDER — HYDROCODONE-ACETAMINOPHEN 5-325 MG PO TABS: 1.0000 | ORAL_TABLET | ORAL | Status: DC | PRN

## 2018-08-31 MED ORDER — SODIUM CHLORIDE 0.9 % IV SOLN
1000.0000 mL | Freq: Once | INTRAVENOUS | Status: AC
  Administered 2018-08-31: 1000 mL via INTRAVENOUS

## 2018-08-31 MED ORDER — DEXTROSE 50 % IV SOLN
25.0000 mL | Freq: Once | INTRAVENOUS | Status: AC
  Administered 2018-08-31: 25 mL via INTRAVENOUS
  Filled 2018-08-31: qty 50

## 2018-08-31 NOTE — Consult Note (Addendum)
Reason for Consult: Respiratory failure  Referring Physician: Hospitalist service  Aaron Ortiz is an 62 y.o. male.   HPI: Aaron Ortiz has a past medical history remarkable for hypertension, COPD, prior history of cord compression and is now nonambulatory, presents with increasing shortness of breath, upon arrival to his house EMS noted saturations in the 60s.  He was subtotally brought to the emergency department, pertinent labs reveal chest x-ray for probable mucous plugging in the distal right mainstem with superimposed infection/pneumonia, BNP is elevated at 1227, sodium 132, potassium 5.8, CO2 35, BUN 55/creatinine 3.0, elevated liver function tests with an AST of 673, ALT of 1077, elevated troponin of 0.92, white count is 17.3  Past Medical History:  Diagnosis Date  . COPD (chronic obstructive pulmonary disease) ([place])   . Hypertension     History reviewed. No pertinent surgical history.  History reviewed. No pertinent family history.  Social History:  reports that he has been smoking. He has never used smokeless tobacco. He reports that he drank alcohol. He reports that he has current or past drug history.  Allergies: No Known Allergies  Medications: I have reviewed the patient's current medications.  Results for orders placed or performed during the hospital encounter of 09/10/2018 (from the past 48 hour(s))  Brain natriuretic peptide     Status: Abnormal   Collection Time: 08/30/2018  8:35 AM  Result Value Ref Range   B Natriuretic Peptide 1,227.0 (H) 0.0 - 100.0 pg/mL    Comment: Performed at [facility], [address] [place], [place] [address]  CBC     Status: Abnormal   Collection Time: 09/13/2018  8:45 AM  Result Value Ref Range   WBC 17.3 (H) 3.8 - 10.6 K/uL   RBC 4.76 4.40 - 5.90 MIL/uL   Hemoglobin 16.7 13.0 - 18.0 g/dL   HCT 49.2 40.0 - 52.0 %   MCV 103.3 (H) 80.0 - 100.0 fL   MCH 35.2 (H) 26.0 - 34.0 pg   MCHC 34.0 32.0 - 36.0 g/dL   RDW 13.0 11.5 - 14.5 %    Platelets 247 150 - 440 K/uL    Comment: Performed at [facility], [address] [place], [place] [address]  Comprehensive metabolic panel     Status: Abnormal   Collection Time: 09/05/2018  8:45 AM  Result Value Ref Range   Sodium 132 (L) 135 - 145 mmol/L   Potassium 5.8 (H) 3.5 - 5.1 mmol/L   Chloride 82 (L) 98 - 111 mmol/L   CO2 35 (H) 22 - 32 mmol/L   Glucose, Bld 126 (H) 70 - 99 mg/dL   BUN 55 (H) 8 - 23 mg/dL   Creatinine, Ser 3.01 (H) 0.61 - 1.24 mg/dL   Calcium 9.2 8.9 - 10.3 mg/dL   Total Protein 7.8 6.5 - 8.1 g/dL   Albumin 3.8 3.5 - 5.0 g/dL   AST 673 (H) 15 - 41 U/L   ALT 1,077 (H) 0 - 44 U/L   Alkaline Phosphatase 92 38 - 126 U/L   Total Bilirubin 1.2 0.3 - 1.2 mg/dL   GFR calc non Af Amer 20 (L) >60 mL/min   GFR calc Af Amer 24 (L) >60 mL/min    Comment: (NOTE) The eGFR has been calculated using the CKD EPI equation. This calculation has not been validated in all clinical situations. eGFR's persistently <60 mL/min signify possible Chronic Kidney Disease.    Anion gap 15 5 - 15    Comment: Performed at  [facility], [address] [place], [place] [address]  Troponin I     Status: Abnormal   Collection Time: 08/25/2018  8:45 AM  Result Value Ref Range   Troponin I 0.92 (HH) <0.03 ng/mL    Comment: CRITICAL RESULT CALLED TO, READ BACK BY AND VERIFIED WITH  Melissa Montane AT 4709 09/07/2018 SDR Performed at [facility], [address] [place], [place] [address]   Lactic acid, plasma     Status: None   Collection Time: 09/10/2018  8:45 AM  Result Value Ref Range   Lactic Acid, Venous 1.8 0.5 - 1.9 mmol/L    Comment: Performed at [facility], [address] [place], [place] [address]    Dg Chest Portable 1 View  Result Date: 09/09/2018 CLINICAL DATA:  Shortness of breath EXAM: PORTABLE CHEST 1 VIEW COMPARISON:  01/27/2018 FINDINGS: Cardiomegaly with vascular congestion. Focal right lower lobe consolidation. Large hiatal  hernia. No visible significant effusions. IMPRESSION: Cardiomegaly with vascular congestion. Focal right lower lobe airspace opacity concerning for pneumonia. Large hiatal hernia. Electronically Signed   By: Rolm Baptise M.D.   On: 09/07/2018 09:06    ROS   Unable to obtain review of systems at this time.  Patient is somewhat somnolent on BiPAP  Blood pressure (!) 84/58, pulse 84, temperature (!) 91.6 F (33.1 C), resp. rate 20, height 5' 10"  (1.778 m), weight 111.1 kg, SpO2 (!) 87 %. Physical Exam   Patient is somnolent but arousable, on noninvasive HEENT: Trachea midline, limited oral exam secondary to BiPAP, no jugular venous distention noted, mild to moderate accessory muscle utilization Cardiovascular: Regular rate and rhythm Pulmonary: Coarse rhonchi appreciated right greater than left with bronchial breath sounds Abdominal: Positive bowel sounds, mildly distended Extremities: Both lower extremities are bandaged from chronic venous stasis and lower extremity edema Neurologic: Patient is somnolent but arousable, has very limited movement in his lower extremity can only wiggle toes Cutaneous: Lower extremity venous stasis changes  Assessment/Plan:  Respiratory failure.  Patient's chest x-ray revealed combination mucous plugging/pneumonia, agree with broad-spectrum antibiotic coverage, underlying history of COPD, Solu-Medrol, albuterol, Atrovent, chest percussive therapy, if patient requires intubation may need bronchoscopy  Renal failure.  Fluid resuscitation  Hyperkalemia.  Patient has received calcium, insulin, bicarbonate, D50, nephrology consultation, repeat BMP  Elevated BNP.  Will check echocardiogram  Leukocytosis.  For with broad-spectrum coverage  History of cord compression.  Patient is in mobile, DVT and ulcer prophylaxis  Elevated troponin at 0.92.  Most likely reflect supply demand ischemia, EKG reveals right bundle branch pattern with right axis  deviation  Critical Care Time 35 minutes  Athel Merriweather 08/28/2018, 11:32 AM

## 2018-08-31 NOTE — ED Notes (Signed)
Admitting MD Luberta Mutter provided verbal orders to insert foley cath at this time

## 2018-08-31 NOTE — Progress Notes (Signed)
Called to room for pt in resp distress. Pt placed on BiPAP per MD order. BiPAP plugged into red outlet

## 2018-08-31 NOTE — ED Notes (Signed)
Attempted to call report at this time. Unable to give report

## 2018-08-31 NOTE — Progress Notes (Signed)
CODE SEPSIS - PHARMACY COMMUNICATION  **Broad Spectrum Antibiotics should be administered within 1 hour of Sepsis diagnosis**  Time Code Sepsis Called/Page Received: 0907  Antibiotics Ordered: Ceftriaxone/Azithromycin  Time of 1st antibiotic administration: 0919 Ceftriaxone  Additional action taken by pharmacy: none indicated   If necessary, Name of Provider/Nurse Contacted: N/A    Simpson,Michael L ,PharmD Clinical Pharmacist  09-08-18  11:55 AM

## 2018-08-31 NOTE — ED Notes (Signed)
Date and time results received: 09/13/2018  Test: troponin Critical Value: 0.92  Name of Provider Notified: Kinner  Orders Received? Or Actions Taken?: MD notified

## 2018-08-31 NOTE — ED Notes (Addendum)
Levophed not initiated at this time due to parameters being met without admin. sys > 90 and map > 65

## 2018-08-31 NOTE — ED Notes (Addendum)
Pt continuously talking and stating yeah over and over. Pt unable to follow commands at this time but is able to state name and birthday still. Pt breathing through mouth and not getting o2 from [facility]. Resp contacted to initiate bi pap in ed

## 2018-08-31 NOTE — ED Notes (Signed)
Temperature recorded at 1200 is not accurate connection cord malfunction

## 2018-08-31 NOTE — Progress Notes (Signed)
Remained unresponsive since admission. Very little reaction even to pain. Core temperature 35.3 C per temp foley. Warming blanket added.

## 2018-08-31 NOTE — H&P (Signed)
[facility] - [place] at [facility]   PATIENT NAME: Aaron Ortiz    MR#:  [id number]  DATE OF BIRTH:  [birth year]  DATE OF ADMISSION:  09/10/2018  PRIMARY CARE PHYSICIAN: Michaele Offer, DO   REQUESTING/REFERRING PHYSICIAN: Dr. Cyril Loosen  CHIEF COMPLAINT: Shortness of breath   Chief Complaint  Patient presents with  . Shortness of Breath    HISTORY OF PRESENT ILLNESS:  Aaron Ortiz  is a 62 y.o. male with a known history of essential hypertension, COPD, history of cord compression who is not ambulatory comes in with worsening shortness of breath for last 3 days associated with poor p.o. intake, decreased urine output.  Patient followed by home health and also physician in [facility].  Patient has bilateral venous stasis getting Una wraps twice a week by home health nurse in [place].  Patient found to be hypoxic with O2 sat with room air sat 60% at home, chest x-ray showed possible pneumonia on the right side, patient also found to have elevated BNP, creatinine up to 3, patient is hypotensive with blood pressure 90/70, received 2 L of normal saline in the emergency room.  Patient still hypoxic on 4 L of oxygen saturation is 89 to 90%, he is alert and awake at times.  Looks like he is in delirium.  PAST MEDICAL HISTORY:   Past Medical History:  Diagnosis Date  . COPD (chronic obstructive pulmonary disease) ([facility])   . Hypertension     PAST SURGICAL HISTOIRY:  History reviewed. No pertinent surgical history.  SOCIAL HISTORY:   Social History   Tobacco Use  . Smoking status: Current Every Day Smoker  . Smokeless tobacco: Never Used  Substance Use Topics  . Alcohol use: Not Currently    FAMILY HISTORY:  History reviewed. No pertinent family history.  DRUG ALLERGIES:  No Known Allergies  REVIEW OF SYSTEMS:  CONSTITUTIONAL: No fever, fatigue or weakness.  EYES: No blurred or double vision.  EARS, NOSE, AND THROAT: No tinnitus or ear  pain.  RESPIRATORY: Cough, shortness of breath getting progressively worse CARDIOVASCULAR: No chest pain, orthopnea, has leg edema. GASTROINTESTINAL: No nausea, vomiting, diarrhea or abdominal pain.  GENITOURINARY: No dysuria, hematuria.  ENDOCRINE: No polyuria, nocturia,  HEMATOLOGY: No anemia, easy bruising or bleeding SKIN: No rash or lesion. MUSCULOSKELETAL: No joint pain or arthritis.   NEUROLOGIC: No tingling, numbness, weakness.  PSYCHIATRY: In and out of consciousness.                                                                                         MEDICATIONS AT HOME:   Prior to Admission medications   Medication Sig Start Date End Date Taking? Authorizing Provider  furosemide (LASIX) 20 MG tablet Take 20 mg by mouth daily as needed for edema. 08/03/18  Yes [provider]  ibuprofen (ADVIL,MOTRIN) 200 MG tablet Take 400-800 mg by mouth every 6 (six) hours as needed for headache or mild pain.   Yes [provider]  lisinopril-hydrochlorothiazide (PRINZIDE,ZESTORETIC) 10-12.5 MG tablet Take 1 tablet by mouth daily. 07/06/18  Yes [provider]  multivitamin-iron-minerals-folic acid (CENTRUM) chewable tablet  Chew 1 tablet by mouth daily.   Yes [provider]  omega-3 acid ethyl esters (LOVAZA) 1 g capsule Take 1 g by mouth daily.   Yes [provider]  potassium chloride (MICRO-K) 10 MEQ CR capsule Take 10 mEq by mouth daily. 08/03/18  Yes [provider]  STIOLTO RESPIMAT 2.5-2.5 MCG/ACT AERS Inhale 2 puffs into the lungs every morning. 07/28/18  Yes [provider]  VENTOLIN HFA 108 (90 Base) MCG/ACT inhaler Inhale 2 puffs into the lungs every 4 (four) hours as needed for wheezing or shortness of breath. 08/10/18  Yes [provider]      VITAL SIGNS:  Blood pressure (!) 84/58, pulse 84, temperature (!) 91.6 F (33.1 C), resp. rate 20, height 5\' 10"  (1.778 m), weight 111.1 kg, SpO2 (!) 87 %.  PHYSICAL  EXAMINATION:  GENERAL:  62 y.o.-year-old patient lying in the bed with acute shortness of breath, complains of leg spasms. EYES: Pupils equal, round, reactive to light and accommodation. No scleral icterus. Extraocular muscles intact.  HEENT: Head atraumatic, normocephalic. Oropharynx and nasopharynx clear.  NECK:  Supple, no jugular venous distention. No thyroid enlargement, no tenderness.  LUNGS: Coarse breath sounds bilaterally.Marland Kitchen  CARDIOVASCULAR: S1, S2 normal. No murmurs, rubs, or gallops.  ABDOMEN: Soft, nontender, nondistended. Bowel sounds present. No organomegaly or mass.  EXTREMITIES: Patient has bilateral  unna wrap NEUROLOGIC: Cranial nerves II through XII are intact..paraplegic Sensation intact. Gait not checked.  PSYCHIATRIC: The patient is alert and oriented x 3.  SKIN: bilateral leg unna wraps  LABORATORY PANEL:   CBC Recent Labs  Lab 08/26/2018 0845  WBC 17.3*  HGB 16.7  HCT 49.2  PLT 247   ------------------------------------------------------------------------------------------------------------------  Chemistries  Recent Labs  Lab 08/30/2018 0845  NA 132*  K 5.8*  CL 82*  CO2 35*  GLUCOSE 126*  BUN 55*  CREATININE 3.01*  CALCIUM 9.2  AST 673*  ALT 1,077*  ALKPHOS 92  BILITOT 1.2   ------------------------------------------------------------------------------------------------------------------  Cardiac Enzymes Recent Labs  Lab 08/25/2018 0845  TROPONINI 0.92*   ------------------------------------------------------------------------------------------------------------------  RADIOLOGY:  Dg Chest Portable 1 View  Result Date: 09/21/2018 CLINICAL DATA:  Shortness of breath EXAM: PORTABLE CHEST 1 VIEW COMPARISON:  01/27/2018 FINDINGS: Cardiomegaly with vascular congestion. Focal right lower lobe consolidation. Large hiatal hernia. No visible significant effusions. IMPRESSION: Cardiomegaly with vascular congestion. Focal right lower lobe airspace  opacity concerning for pneumonia. Large hiatal hernia. Electronically Signed   By: Charlett Nose M.D.   On: 09/04/2018 09:06    EKG:   Orders placed or performed during the hospital encounter of 09/06/2018  . EKG 12-Lead  . EKG 12-Lead   EKG showed sinus tachycardia with 104 bpm, T wave inversion in V V1 through V5. 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  Also has poor p.o. intake for last 3 days associated with poor urine output. 1.  Acute respiratory failure with hypoxia likely due to combination of milk of breath, pneumonia continue oxygen, broad-spectrum antibiotics, IV steroids, bronchodilators, patient is DNR but wife is okay with BiPAP, pressors, antibiotics. 2.  Sepsis with septic shock, with evidence of endorgan damage with renal failure, shock liver received 2 L  of normal saline but he has rales bilaterally unable to give any more fluids, as per sepsis protocol we can use Levophed for pressure support. #3. acute renal failure with hyperkalemia receiving calcium, insulin, bicarb, D50, nephrology consult, patient has no prior labs in care everywhere and followed by [facility] health physician and wife is not aware of any kidney problems for him acute renal failure likely due to ATN and hypotension nephrology is consulted, patient had a Foley in the emergency room with only small amount of urine came.  Continue to monitor urine output, continue pressure support, nephrology consult is appreciated.  Patient was taking lisinopril at home so hold nephrotoxic agents. 4.  Heart failure with elevated BNP, unknown whether systolic or diastolic check echocardiogram, cardiology  consult requested.  Not able to use Lasix because of renal failure and hypotension. 5.  Elevated LFTs with ALT thousand 77, AST 673 likely due to shock liver follow LFTs closely. 6.  Non-ST elevation MI with elevated troponin up to 0.92 likely due to supply demand ischemia with septic shock, pneumonia cycle the troponins, unable to use beta-blocker due to hypotension, cannot give aspirin. 7.  History of cord compression with paraplegia, patient was seen in the emergency room on April and found to have T7 compression fracture,, patient refused to go to [facility] for neurosurgical intervention but he is paralyzed no and has paraplegia, and he did not want to have surgery and wife also told me about that today. 8.  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 9.  Condition critical, high risk for cardiac arrest, patient's wife is aware.   All the records are reviewed and case discussed with ED provider. Management plans discussed with the patient, family and they are in agreement.  CODE STATUS: DNR  TOTAL TIME TAKING CARE OF THIS PATIENT: 70 minutes.    Katha Hamming M.D on 09/11/2018 at 11:42 AM  Between 7am to 6pm - Pager - [phone]  After 6pm go to [email or web address] - password [email or web address] [facility]  [facility]  Office  [phone]  CC: Primary care physician; Michaele Offer, DO  Note: This dictation was prepared with Dragon dictation along with smaller phrase technology. Any transcriptional errors that result from this process are unintentional.

## 2018-08-31 NOTE — Progress Notes (Signed)
62 year old white male admitted to ICU room 7. Presently on BiPAP FIO2 at 100%. Heart rate 79. Responsive only to deep sternal rub or fingernail pressure but does not stay awake but a few seconds. Bilateral legs wrapped with coban from knees down including feet and left big toe.  Bilateral toes are swollen more on right foot. Both elbows are scabbed. MASD in groin area bilaterally and rectal area.buttocks are red but blanche. Small area of breakdown noted around anus . Fungal infection noted under left fingernails and nicotine stains noted on right fingers.

## 2018-08-31 NOTE — Progress Notes (Signed)
Pt transported to ICU on BiPAP. BiPAP plugged into red outlet. Report given to ICU Therapist

## 2018-08-31 NOTE — ED Provider Notes (Signed)
[facility] Emergency Department Provider Note   ____________________________________________    I have reviewed the triage vital signs and the nursing notes.   HISTORY  Chief Complaint Shortness of Breath     HPI Aaron Ortiz is a 62 y.o. male presents with shortness of breath.  Per EMS he was satting in the high 60s at home, they started nasal cannula oxygen with good improvement.  Patient states he does not know what is causing his shortness of breath.  Review of medical records demonstrates visit to emergency department several months ago with severe cord compression, patient did not want emergent surgery.  Because of this he is now nonambulatory, has significant edema of his lower extremities.  Denies chest pain.  No fevers reported.  No pleurisy.   Past Medical History:  Diagnosis Date  . COPD (chronic obstructive pulmonary disease) (HCC)   . Hypertension     There are no active problems to display for this patient.   History reviewed. No pertinent surgical history.  Prior to Admission medications   Medication Sig Start Date End Date Taking? Authorizing Provider  furosemide (LASIX) 20 MG tablet Take 20 mg by mouth daily as needed for edema. 08/03/18  Yes [provider]  lisinopril-hydrochlorothiazide (PRINZIDE,ZESTORETIC) 10-12.5 MG tablet Take 1 tablet by mouth daily. 07/06/18  Yes [provider]  potassium chloride (MICRO-K) 10 MEQ CR capsule Take 10 mEq by mouth daily. 08/03/18  Yes [provider]  STIOLTO RESPIMAT 2.5-2.5 MCG/ACT AERS Inhale 2 puffs into the lungs every morning. 07/28/18  Yes [provider]  VENTOLIN HFA 108 (90 Base) MCG/ACT inhaler Inhale 2 puffs into the lungs every 4 (four) hours as needed for wheezing or shortness of breath. 08/10/18  Yes [provider]     Allergies Patient has no known allergies.  History reviewed. No pertinent family history.  Social History Social  History   Tobacco Use  . Smoking status: Current Every Day Smoker  . Smokeless tobacco: Never Used  Substance Use Topics  . Alcohol use: Not Currently  . Drug use: Not Currently    Review of Systems  Constitutional: No fever/chills Eyes: No visual changes.  ENT: No neck pain Cardiovascular: Denies chest pain. Respiratory: As above Gastrointestinal: No nausea, no vomiting.   Genitourinary: Negative for dysuria. Musculoskeletal: Chronic back pain Skin: No diaphoresis Neurological: Negative for headaches    ____________________________________________   PHYSICAL EXAM:  VITAL SIGNS: ED Triage Vitals [09/06/2018 0830]  Enc Vitals Group     BP      Pulse      Resp      Temp      Temp src      SpO2 92 %     Weight      Height      Head Circumference      Peak Flow      Pain Score      Pain Loc      Pain Edu?      Excl. in GC?     Constitutional: Alert and oriented.  Ill-appearing  Nose: No congestion/rhinnorhea. Mouth/Throat: Mucous membranes are moist.    Cardiovascular: Tachycardia, regular rhythm. Grossly normal heart sounds.  Good peripheral circulation. Respiratory: Increased respiratory effort with tachypnea.  No retractions.  Scattered mild wheezes Gastrointestinal: Soft and nontender. No distention.    Musculoskeletal: Lower extreme knees wrapped in Unna boots, normal cap refill bilaterally.  Warm and well perfused Neurologic:  Normal speech  and language.  Skin:  Skin is warm, dry and intact. No rash noted. Psychiatric: Mood and affect are normal. Speech and behavior are normal.  ____________________________________________   LABS (all labs ordered are listed, but only abnormal results are displayed)  Labs Reviewed  CBC - Abnormal; Notable for the following components:      Result Value   WBC 17.3 (*)    MCV 103.3 (*)    MCH 35.2 (*)    All other components within normal limits  COMPREHENSIVE METABOLIC PANEL - Abnormal; Notable for the  following components:   Sodium 132 (*)    Potassium 5.8 (*)    Chloride 82 (*)    CO2 35 (*)    Glucose, Bld 126 (*)    BUN 55 (*)    Creatinine, Ser 3.01 (*)    AST 673 (*)    ALT 1,077 (*)    GFR calc non Af Amer 20 (*)    GFR calc Af Amer 24 (*)    All other components within normal limits  TROPONIN I - Abnormal; Notable for the following components:   Troponin I 0.92 (*)    All other components within normal limits  CULTURE, BLOOD (ROUTINE X 2)  CULTURE, BLOOD (ROUTINE X 2)  LACTIC ACID, PLASMA  BRAIN NATRIURETIC PEPTIDE  LACTIC ACID, PLASMA   ____________________________________________  EKG  ED ECG REPORT I, Jene Every, the attending physician, personally viewed and interpreted this ECG.  Date: 09-16-18  Rhythm: Sinus tachycardia QRS Axis: normal Intervals: Abnormal ST/T Wave abnormalities: None specific changes Narrative Interpretation: no evidence of acute ischemia  ____________________________________________  RADIOLOGY  Chest x-ray consistent with cardiomegaly and pneumonia ____________________________________________   PROCEDURES  Procedure(s) performed: No  Procedures   Critical Care performed: yes  CRITICAL CARE Performed by: Jene Every   Total critical care time:30 minutes  Critical care time was exclusive of separately billable procedures and treating other patients.  Critical care was necessary to treat or prevent imminent or life-threatening deterioration.  Critical care was time spent personally by me on the following activities: development of treatment plan with patient and/or surrogate as well as nursing, discussions with consultants, evaluation of patient's response to treatment, examination of patient, obtaining history from patient or surrogate, ordering and performing treatments and interventions, ordering and review of laboratory studies, ordering and review of radiographic studies, pulse oximetry and re-evaluation of  patient's condition.  ____________________________________________   INITIAL IMPRESSION / ASSESSMENT AND PLAN / ED COURSE  Pertinent labs & imaging results that were available during my care of the patient were reviewed by me and considered in my medical decision making (see chart for details).  Patient presents with shortness of breath, history of spinal cord compression which is left him nonambulatory.  Differential includes COPD, CHF, pneumonia, less likely ACS.  Pending labs x-ray.  Oxygen saturation 70% without supplemental oxygen consistent with respiratory failure  Lab work is quite concerning, creatinine is elevated at 3, potassium is also elevated white blood cell count is elevated.  Chest x-ray consistent with pneumonia.  Troponin elevated likely related to demand ischemia given hypoxia upon arrival, also concerning elevation in LFTs.  Question period of hypotension  Normotensive upon arrival to ED. Lactic normal.  ----------------------------------------- 10:05 AM on 09-16-2018 -----------------------------------------  Notified of borderline low blood pressure, will start IV fluids, discussed with hospitalist for admission    ____________________________________________   FINAL CLINICAL IMPRESSION(S) / ED DIAGNOSES  Final diagnoses:  Community acquired pneumonia of right lower  lobe of lung (HCC)  Acute respiratory failure with hypoxia (HCC)  Metabolic encephalopathy  Shock liver  Elevated troponin        Note:  This document was prepared using Dragon voice recognition software and may include unintentional dictation errors.    Jene Every, MD 09/17/2018 1006

## 2018-08-31 NOTE — ED Triage Notes (Signed)
Pt arrives via ems from home with reports of SOB. Ems states that on arrival initial oxygen sat was low 70's. Pt places on 10L non rebreather and sats increased to 92. Ems states pt baseline o2 as 88-89%. On arrival pt appears to be working to breath, coarse breath sounds can be heard on inspiration and wheezing with exhaling. Pt baseline o2 without oxygen in 70's pt placed on 4L . Pt leaning to far right when self positioning. Pt hx of bilateral leg paralysis.

## 2018-08-31 NOTE — Progress Notes (Signed)
I have spoken with pt's wife and updated her emphasizing his poor prognosis. He is DNR/DNI based on his previously expressed wishes. She also indicates that she would not wish to have dialysis, even short term, initiated under any circumstances. We will continue BiPAP, abx, IVFs, nebulized BDs.  I have cancelled caridology and nephrology consultation requests  Billy Fischer, MD PCCM service Mobile [phone] Pager [phone] 08/30/2018 1:41 PM

## 2018-08-31 NOTE — ED Notes (Signed)
This RN notified admitting MD of pt o2 saturations declining. MD states she would like to place pt on bipap once pt is taken to floor. o2 at this time is currently 91%. MD also states she does not want a 3 liters fluids given to meet sepsis bundle and states she will place note in chart regarding reason for not administering 3 liters

## 2018-09-01 ENCOUNTER — Inpatient Hospital Stay: Payer: BLUE CROSS/BLUE SHIELD

## 2018-09-01 LAB — ECHOCARDIOGRAM COMPLETE
Height: 68 in
WEIGHTICAEL: 3901.26 [oz_av]

## 2018-09-01 LAB — HIV ANTIBODY (ROUTINE TESTING W REFLEX): HIV Screen 4th Generation wRfx: NONREACTIVE

## 2018-09-01 LAB — GLUCOSE, CAPILLARY: Glucose-Capillary: 102 mg/dL — ABNORMAL HIGH (ref 70–99)

## 2018-09-01 MED ORDER — DOPAMINE-DEXTROSE 3.2-5 MG/ML-% IV SOLN
0.0000 ug/kg/min | INTRAVENOUS | Status: DC
  Administered 2018-09-01: 10 ug/kg/min via INTRAVENOUS

## 2018-09-01 MED ORDER — ATROPINE SULFATE 1 MG/10ML IJ SOSY
1.0000 mg | PREFILLED_SYRINGE | Freq: Once | INTRAMUSCULAR | Status: AC
  Administered 2018-09-01: 1 mg via INTRAVENOUS

## 2018-09-01 MED ORDER — DOPAMINE-DEXTROSE 3.2-5 MG/ML-% IV SOLN
INTRAVENOUS | Status: AC
  Filled 2018-09-01: qty 250

## 2018-09-03 ENCOUNTER — Telehealth: Payer: Self-pay

## 2018-09-03 NOTE — Telephone Encounter (Signed)
Moved to Dr. Clovis Fredrickson folder.

## 2018-09-03 NOTE — Telephone Encounter (Signed)
Death Certificate received and placed in Dr. Georgann Housekeeper folder for signature.

## 2018-09-03 NOTE — Telephone Encounter (Signed)
Recieved Death Certificate from __[place] ________ Delivered/Placed _________in nurse box___

## 2018-09-05 LAB — CULTURE, BLOOD (ROUTINE X 2)
Culture: NO GROWTH
Culture: NO GROWTH

## 2018-09-06 NOTE — Telephone Encounter (Signed)
Certificate complete and ready for pickup at front desk. [employer] aware.

## 2018-09-24 NOTE — Progress Notes (Signed)
Heart rate in the 20s. RN at bedside waiting for wife.

## 2018-09-24 NOTE — Progress Notes (Signed)
Called to pt's bedside by RN at approximately 0445, due to Hypoxia despite 100% BiPAP, agonal respirations, worsening hypotension (despite titrating up on Neo-synephrine gtt), and bradycardia with HR in 20's to 30's.  Pt is a DNR/DNI.  RN called and notified pt's wife of his change in status, and wife is en route to hospital.  Pt was given 1 mg Atropine and placed on Dopamine gtt (already on Neo-synephrine gtt) in an effort to temporarily maintain pt until wife's arrival.  HR briefly remained in 20's with a weak thready pulse by doppler.  However despite these efforts, pt went into PEA with eventual progression to Asystole, and apnea.  Time of death is 0501.  Pt's wife arrived shortly after pt expiring. Updated pts' wife on his decline, efforts made, and eventual expiring. Emotional support provided by NP, RN, and Chaplain.  Wife is very Adult nurse of efforts.     Harlon Ditty, AGACNP-BC [facility] Pager: [phone]

## 2018-09-24 NOTE — Progress Notes (Signed)
Wife at bedside, gave funeral home information and wants to sit with patient a little longer. Stated there was no one else to call, she does not have any family or friends just her animals. Chaplin and RN stayed with wife for a while for support.

## 2018-09-24 NOTE — Progress Notes (Signed)
Called wife to inform that heart rate dropping to 30s. She said she was on her way. Called NP to notify as well.

## 2018-09-24 NOTE — Progress Notes (Signed)
NP in room and pronounced patient at 0501. Central tele notified.

## 2018-09-24 NOTE — Death Summary Note (Signed)
DEATH SUMMARY   Patient Details  Name: Aaron Ortiz MRN: [id number] DOB: Nov 22, 1956  Admission/Discharge Information   Admit Date:  September 19, 2018  Date of Death:  Sep 20, 2018  Time of Death:  0501  Length of Stay: 1  Referring Physician: Michaele Offer, DO   Reason(s) for Hospitalization  Acute Hypoxic Respiratory Failure requiring BiPAP  Diagnoses  Preliminary cause of death:   Acute Hypoxic Respiratory Failure Secondary Diagnoses (including complications and co-morbidities):  Active Problems:   Acute respiratory failure with hypoxemia (HCC) Pneumonia Acute Renal Failure Hyperkalemia Leukocytosis Elevated Troponin, likely demand ischemia Elevated BNP  Brief Hospital Course (including significant findings, care, treatment, and services provided and events leading to death)    Aaron Ortiz has a past medical history remarkable for hypertension, COPD, prior history of cord compression and is now nonambulatory, presents 09/19/18 with increasing shortness of breath, upon arrival to his house EMS noted saturations in the 60s.  He was subtotally brought to the emergency department, pertinent labs reveal chest x-ray for probable mucous plugging in the distal right mainstem with superimposed infection/pneumonia, BNP is elevated at 1227, sodium 132, potassium 5.8, CO2 35, BUN 55/creatinine 3.0, elevated liver function tests with an AST of 673, ALT of 1077, elevated troponin of 0.92, white count is 17.3  He was subsequently placed on BIPAP,  Empiric broad-specturm antibiotics, Bronchodilators, IV Solu-Medrol, and chest percussive therapy.  Pt previously expressed that he wanted to be a DNR/DNI, and his wife confirmed that he did not want dialysis (even if short term) as well.  Therefore, pt was not intubated nor Bronchoscopy performed.  On 2023-09-21 at around 0440, pt with Hypoxia despite 100% BiPAP, agonal respirations, worsening hypotension (despite titrating up on Neo-synephrine gtt), and  bradycardia with HR in 20's to 30's.  Pt is a DNR/DNI.   Pt was given 1 mg Atropine and placed on Dopamine gtt (already on Neo-synephrine gtt) in an effort to temporarily maintain pt until wife's arrival.  HR briefly remained in 2023/04/20 with a weak thready pulse by doppler.  However despite these efforts, pt went into PEA with eventual progression to Asystole, and apnea at 0501 on 09-20-18.    Pertinent Labs and Studies  Significant Diagnostic Studies Dg Chest Portable 1 View  Result Date: 09-19-2018 CLINICAL DATA:  Shortness of breath EXAM: PORTABLE CHEST 1 VIEW COMPARISON:  01/27/2018 FINDINGS: Cardiomegaly with vascular congestion. Focal right lower lobe consolidation. Large hiatal hernia. No visible significant effusions. IMPRESSION: Cardiomegaly with vascular congestion. Focal right lower lobe airspace opacity concerning for pneumonia. Large hiatal hernia. Electronically Signed   By: Charlett Nose M.D.   On: Sep 19, 2018 09:06    Microbiology Recent Results (from the past 240 hour(s))  MRSA PCR Screening     Status: None   Collection Time: 19-Sep-2018  1:14 PM  Result Value Ref Range Status   MRSA by PCR NEGATIVE NEGATIVE Final    Comment:        The GeneXpert MRSA Assay (FDA approved for NASAL specimens only), is one component of a comprehensive MRSA colonization surveillance program. It is not intended to diagnose MRSA infection nor to guide or monitor treatment for MRSA infections. Performed at [facility], [address] [place], [place] [address]     Lab Basic Metabolic Panel: Recent Labs  Lab 09/19/18 0845 Sep 19, 2018 1317  NA 132* 134*  K 5.8* 5.5*  CL 82* 91*  CO2 35* 33*  GLUCOSE 126* 101*  BUN 55* 59*  CREATININE 3.01* 2.99*  CALCIUM 9.2 8.7*   Liver Function Tests: Recent Labs  Lab 09/23/2018 0845  AST 673*  ALT 1,077*  ALKPHOS 92  BILITOT 1.2  PROT 7.8  ALBUMIN 3.8   No results for input(s): LIPASE, AMYLASE in the last 168 hours. No results for  input(s): AMMONIA in the last 168 hours. CBC: Recent Labs  Lab 2018/09/23 0845  WBC 17.3*  HGB 16.7  HCT 49.2  MCV 103.3*  PLT 247   Cardiac Enzymes: Recent Labs  Lab Sep 23, 2018 0845 09/23/2018 1317  TROPONINI 0.92* 1.20*   Sepsis Labs: Recent Labs  Lab 09-23-2018 0845 09/23/2018 1317  WBC 17.3*  --   LATICACIDVEN 1.8 1.5    Procedures/Operations  None this hospitalization    Harlon Ditty, AGACNP-BC [facility] Pager: [phone]   09/20/2018, 5:28 AM

## 2018-09-24 NOTE — Progress Notes (Signed)
Patient has not been responsive since assuming care at 2300 from evening shift RN. Suctioned secretions that was pouring out of the right side of mouth. Continue on bipap at 100%. Neosynephrine started at 2327

## 2018-09-24 DEATH — deceased

## 2019-07-09 IMAGING — DX DG CHEST 1V PORT
1 series · 1 of 1 positions shown · non-contrast
Comparison: 01/27/2018

CLINICAL DATA: Shortness of breath

EXAM:
PORTABLE CHEST 1 VIEW

[chest ap]
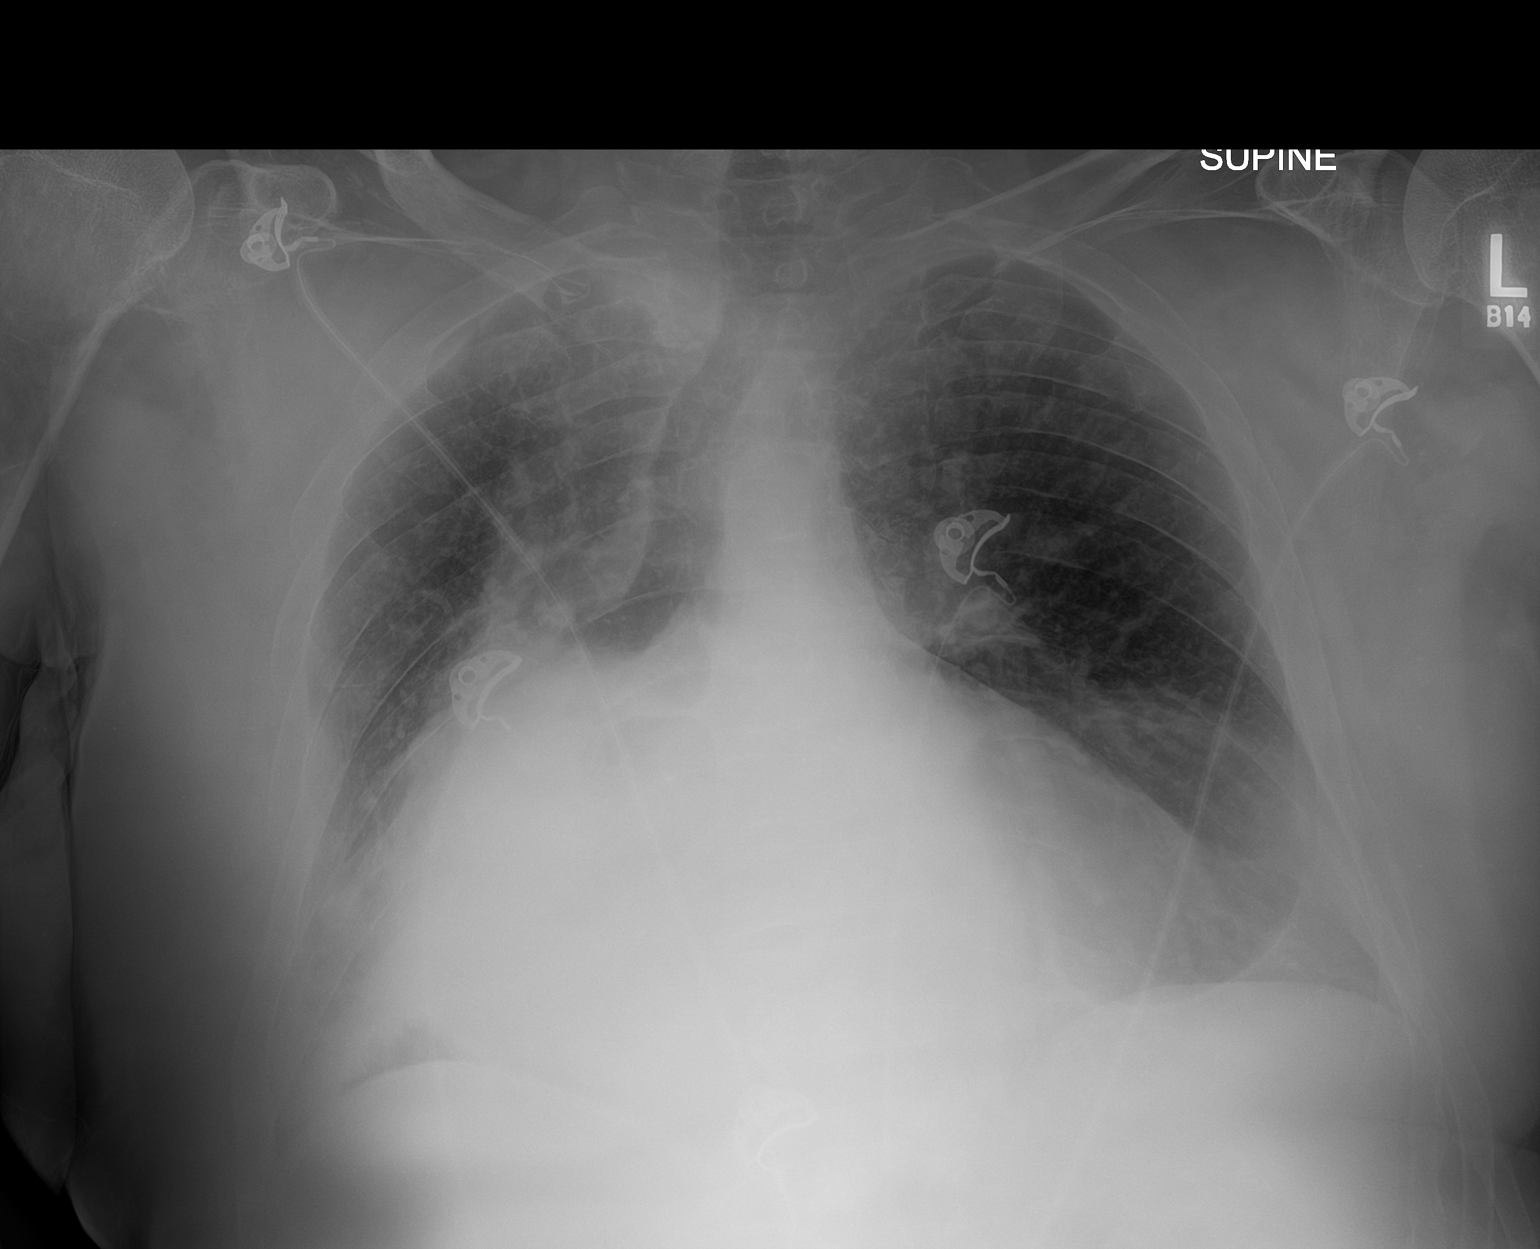

[1 of 1 positions shown; findings below may reference images not displayed]

FINDINGS: Cardiomegaly with vascular congestion. Focal right lower lobe
consolidation. Large hiatal hernia. No visible significant
effusions.
IMPRESSION: Cardiomegaly with vascular congestion.

Focal right lower lobe airspace opacity concerning for pneumonia.

Large hiatal hernia.

## 2019-07-10 IMAGING — DX DG CHEST 1V PORT
1 series · 1 of 1 positions shown · non-contrast
Comparison: August 31, 2018

CLINICAL DATA: Respiratory failure

EXAM:
PORTABLE CHEST 1 VIEW

[chest ap]
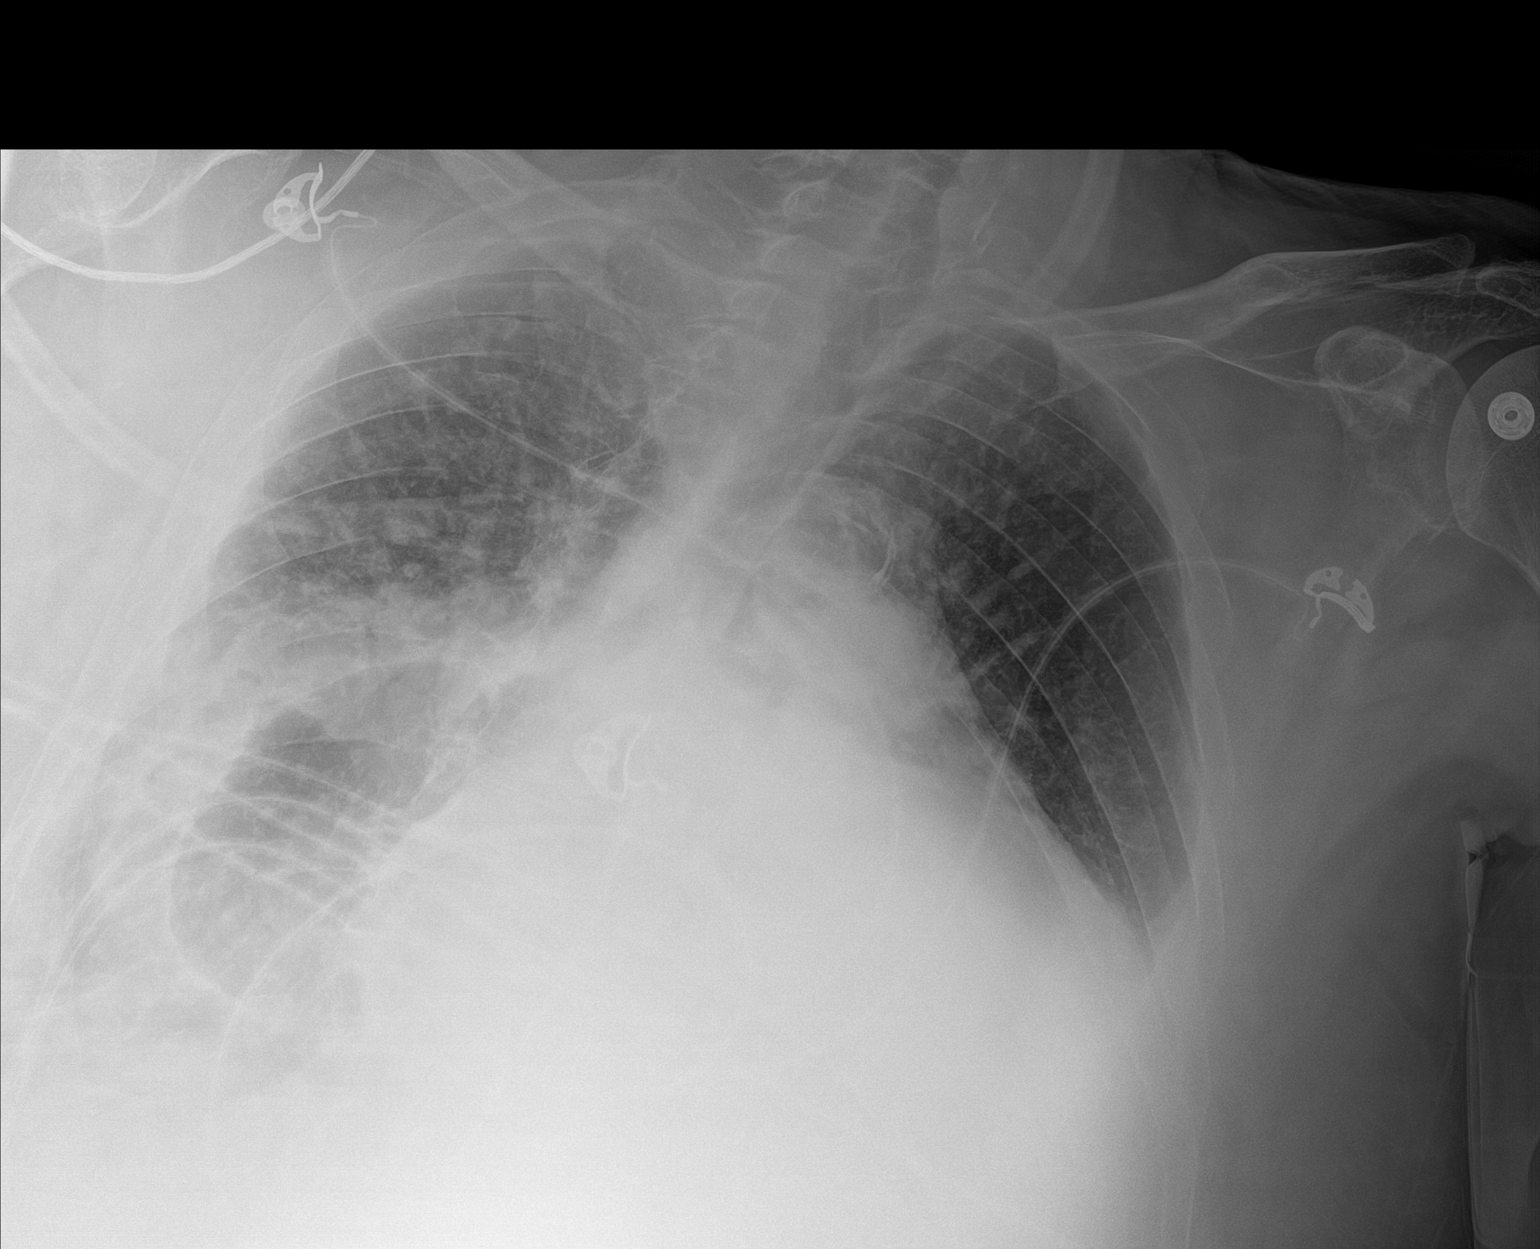

[1 of 1 positions shown; findings below may reference images not displayed]

FINDINGS: There is persistent cardiomegaly with pulmonary vascularity within
normal limits. There are pleural effusions bilaterally with
bibasilar airspace consolidation. There is a sizable hiatal type
hernia. There is no evident adenopathy. There is aortic
atherosclerosis. Bones are osteoporotic. There is calcification in
the left carotid artery.
IMPRESSION: Persistent airspace consolidation in both lung bases with small
pleural effusions.

Stable cardiomegaly. There is aortic atherosclerosis. Pulmonary
vascularity is within normal limits. There is left carotid artery
calcification.

Large hiatal type hernia again noted.

Bones osteoporotic.  No adenopathy.

Aortic Atherosclerosis ([id number]).
